# Patient Record
Sex: Female | Born: 1955 | Race: White | Hispanic: No | Marital: Married | Smoking: Former smoker
Health system: Southern US, Community
[De-identification: ages and names within clinical notes are randomized; demographics above are authoritative.]

## PROBLEM LIST (undated history)

## (undated) DIAGNOSIS — M1990 Unspecified osteoarthritis, unspecified site: Secondary | ICD-10-CM

## (undated) DIAGNOSIS — K219 Gastro-esophageal reflux disease without esophagitis: Secondary | ICD-10-CM

## (undated) DIAGNOSIS — F419 Anxiety disorder, unspecified: Secondary | ICD-10-CM

## (undated) DIAGNOSIS — E785 Hyperlipidemia, unspecified: Secondary | ICD-10-CM

## (undated) DIAGNOSIS — O24419 Gestational diabetes mellitus in pregnancy, unspecified control: Secondary | ICD-10-CM

## (undated) HISTORY — DX: Anxiety disorder, unspecified: F41.9

## (undated) HISTORY — DX: Gestational diabetes mellitus in pregnancy, unspecified control: O24.419

## (undated) HISTORY — PX: DILATION AND CURETTAGE OF UTERUS: SHX78

## (undated) HISTORY — DX: Hyperlipidemia, unspecified: E78.5

## (undated) HISTORY — DX: Unspecified osteoarthritis, unspecified site: M19.90

## (undated) HISTORY — PX: LAPAROSCOPIC HYSTERECTOMY: SHX1926

## (undated) HISTORY — DX: Gastro-esophageal reflux disease without esophagitis: K21.9

---

## 1990-06-30 HISTORY — PX: COLPOSCOPY: SHX161

## 1998-07-16 ENCOUNTER — Other Ambulatory Visit: Admission: RE | Admit: 1998-07-16 | Discharge: 1998-07-16 | Payer: Self-pay | Admitting: Gynecology

## 1999-07-25 ENCOUNTER — Other Ambulatory Visit: Admission: RE | Admit: 1999-07-25 | Discharge: 1999-07-25 | Payer: Self-pay | Admitting: Gynecology

## 2000-03-17 ENCOUNTER — Encounter: Payer: Self-pay | Admitting: Gynecology

## 2000-03-23 ENCOUNTER — Observation Stay (HOSPITAL_COMMUNITY): Admission: RE | Admit: 2000-03-23 | Discharge: 2000-03-24 | Payer: Self-pay | Admitting: Gynecology

## 2000-06-30 HISTORY — PX: LAPAROSCOPIC HYSTERECTOMY: SHX1926

## 2000-08-03 ENCOUNTER — Other Ambulatory Visit: Admission: RE | Admit: 2000-08-03 | Discharge: 2000-08-03 | Payer: Self-pay | Admitting: Gynecology

## 2001-07-08 ENCOUNTER — Ambulatory Visit (HOSPITAL_BASED_OUTPATIENT_CLINIC_OR_DEPARTMENT_OTHER): Admission: RE | Admit: 2001-07-08 | Discharge: 2001-07-08 | Payer: Self-pay | Admitting: Gynecology

## 2001-07-08 ENCOUNTER — Encounter (INDEPENDENT_AMBULATORY_CARE_PROVIDER_SITE_OTHER): Payer: Self-pay | Admitting: Specialist

## 2001-08-25 ENCOUNTER — Other Ambulatory Visit: Admission: RE | Admit: 2001-08-25 | Discharge: 2001-08-25 | Payer: Self-pay | Admitting: Gynecology

## 2002-09-19 ENCOUNTER — Other Ambulatory Visit: Admission: RE | Admit: 2002-09-19 | Discharge: 2002-09-19 | Payer: Self-pay | Admitting: Gynecology

## 2003-10-23 ENCOUNTER — Other Ambulatory Visit: Admission: RE | Admit: 2003-10-23 | Discharge: 2003-10-23 | Payer: Self-pay | Admitting: Gynecology

## 2004-04-29 ENCOUNTER — Other Ambulatory Visit: Admission: RE | Admit: 2004-04-29 | Discharge: 2004-04-29 | Payer: Self-pay | Admitting: Gynecology

## 2004-08-05 ENCOUNTER — Ambulatory Visit: Payer: Self-pay | Admitting: Family Medicine

## 2004-10-08 ENCOUNTER — Other Ambulatory Visit: Admission: RE | Admit: 2004-10-08 | Discharge: 2004-10-08 | Payer: Self-pay | Admitting: Gynecology

## 2005-07-23 ENCOUNTER — Ambulatory Visit: Payer: Self-pay | Admitting: Family Medicine

## 2005-11-10 ENCOUNTER — Other Ambulatory Visit: Admission: RE | Admit: 2005-11-10 | Discharge: 2005-11-10 | Payer: Self-pay | Admitting: Gynecology

## 2006-11-16 ENCOUNTER — Other Ambulatory Visit: Admission: RE | Admit: 2006-11-16 | Discharge: 2006-11-16 | Payer: Self-pay | Admitting: Gynecology

## 2007-11-18 ENCOUNTER — Encounter: Payer: Self-pay | Admitting: Family Medicine

## 2008-01-03 ENCOUNTER — Encounter: Admission: RE | Admit: 2008-01-03 | Discharge: 2008-01-03 | Payer: Self-pay | Admitting: Gynecology

## 2008-01-12 ENCOUNTER — Encounter: Admission: RE | Admit: 2008-01-12 | Discharge: 2008-01-12 | Payer: Self-pay | Admitting: Gynecology

## 2009-01-05 ENCOUNTER — Encounter: Admission: RE | Admit: 2009-01-05 | Discharge: 2009-01-05 | Payer: Self-pay | Admitting: Gynecology

## 2009-08-03 IMAGING — MG MM SCREEN MAMMOGRAM BILATERAL
3 series · 3 of 3 positions shown · non-contrast
Comparison: Prior studies from [HOSPITAL] dated 12/15/05.

DG SCREEN MAMMOGRAM BILATERAL
Bilateral CC and MLO view(s) were taken.

DIGITAL SCREENING MAMMOGRAM WITH CAD:

[R CC]
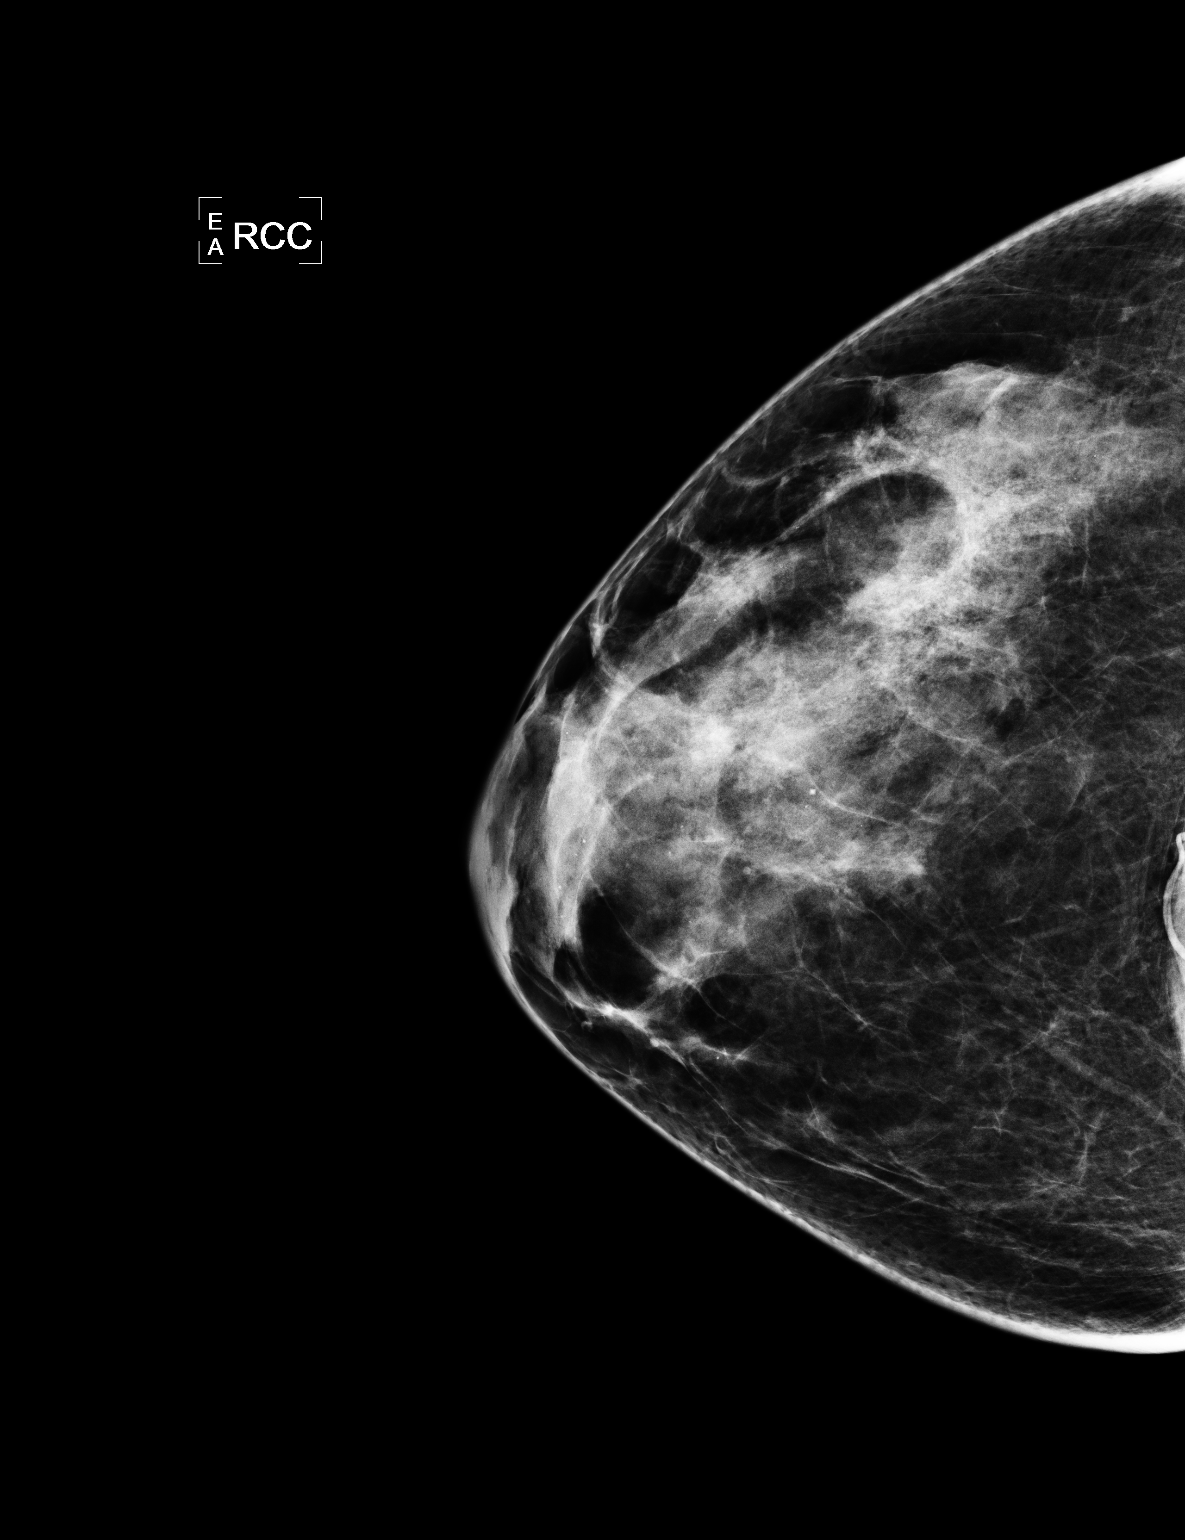

[L MLO]
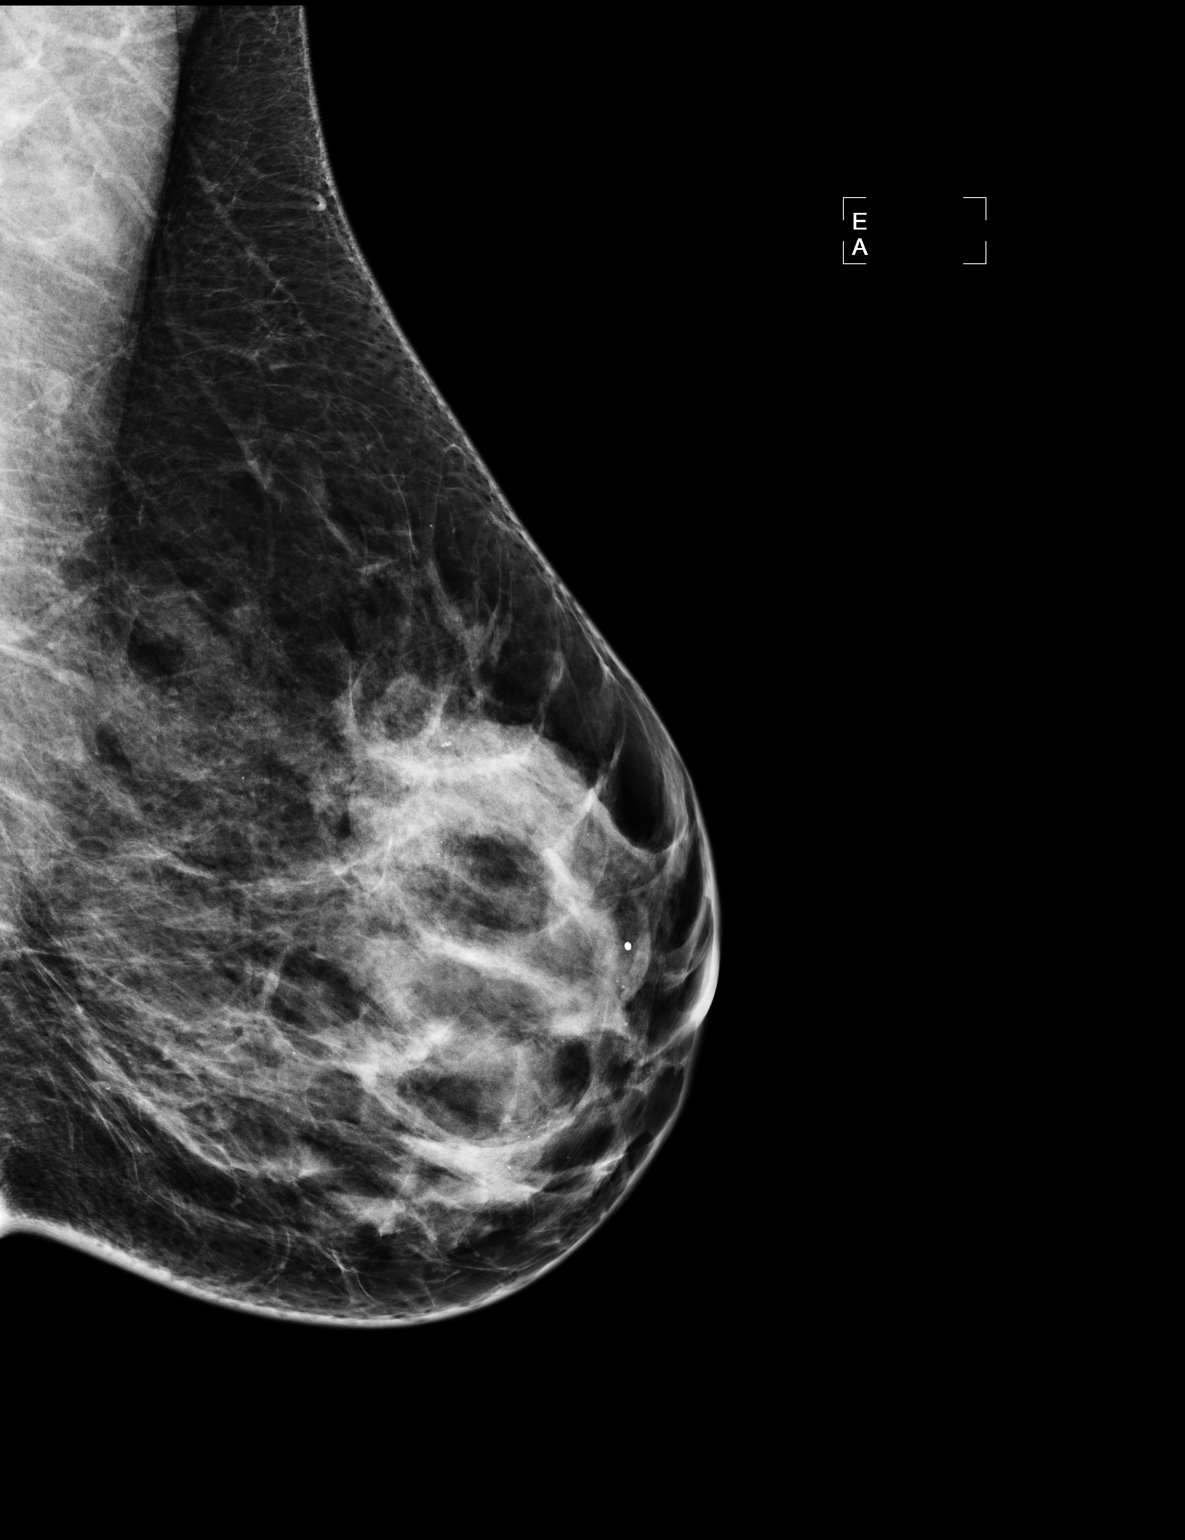

[R MLO]
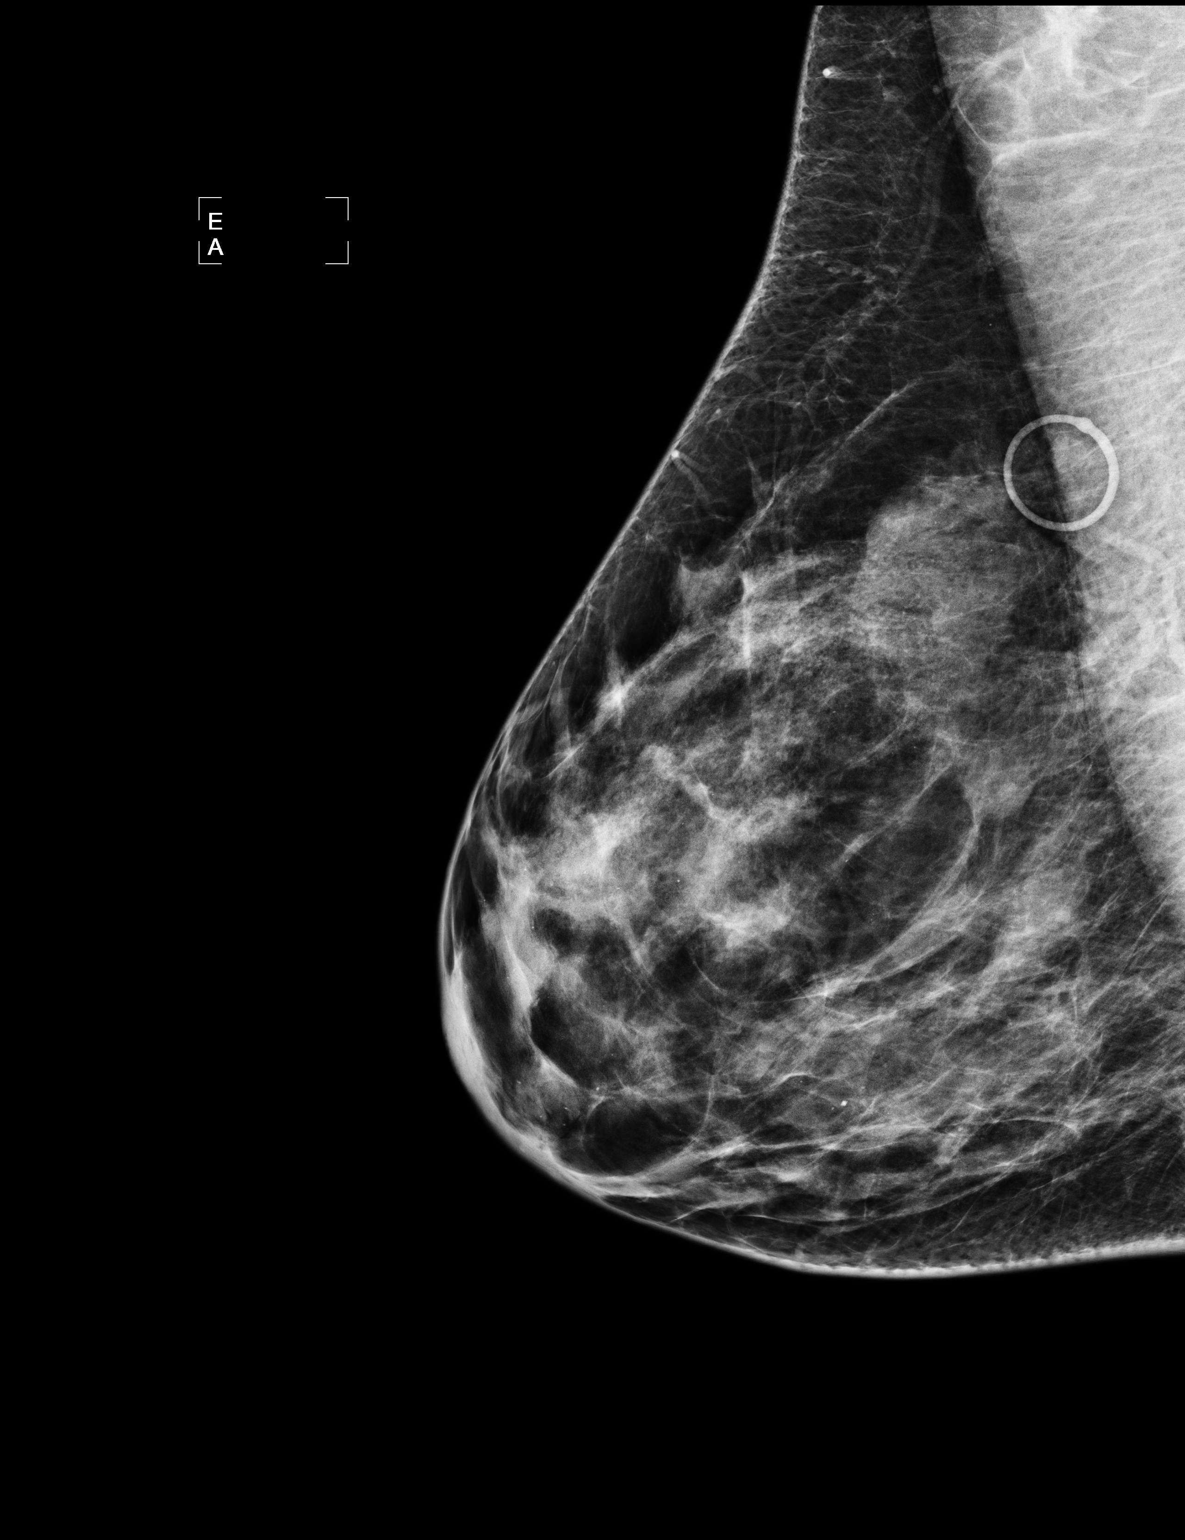

[3 of 3 positions shown; findings below may reference images not displayed]

The breast tissue is heterogeneously dense.  A possible mass is noted in the left breast.  Spot 
compression views and possibly sonography are recommended for further evaluation.  The right breast
is unremarkable.
IMPRESSION: Possible mass, left breast.  Additional evaluation is indicated.  The patient will be contacted for
additional studies and a supplemental report will follow.

ASSESSMENT: Need additional imaging evaluation and/or prior mammograms for comparison - BI-RADS 0 -
Left

Further imaging of the left breast.
ANALYZED BY COMPUTER AIDED DETECTION. , THIS PROCEDURE WAS A DIGITAL MAMMOGRAM.

## 2010-01-07 ENCOUNTER — Encounter: Admission: RE | Admit: 2010-01-07 | Discharge: 2010-01-07 | Payer: Self-pay | Admitting: Gynecology

## 2010-05-06 ENCOUNTER — Ambulatory Visit: Payer: Self-pay | Admitting: Cardiology

## 2010-11-15 NOTE — Op Note (Signed)
[facility]  Patient:    Cassie Vega, Cassie Vega [facility]                      MRN: [id number] Proc. Date: 03/23/00 Attending:  Gretta Cool, M.D. CC:         [facility]   Operative Report  PREOPERATIVE DIAGNOSES: 1. Leiomyomata with abnormal uterine bleeding and encroachment upon the    endometrial cavity. 2. History of abnormal genital cytology.  POSTOPERATIVE DIAGNOSES: 1. Leiomyomata with abnormal uterine bleeding and encroachment upon the    endometrial cavity. 2. History of abnormal genital cytology.  PROCEDURE:  Laparoscopically assisted vaginal hysterectomy.  SURGEON:  Dr. Nicholas Lose.  ASSISTANT:  Dr. Phyllis Ginger.  ANESTHESIA:  General.  DESCRIPTION OF PROCEDURE:  Under excellent general anesthesia endotracheal with the patient prepped and draped in Allen stirrups, the cervix was fitted with a Hulka tenaculum, bladder drained and a subumbilical incision made. After adequate pneumoperitoneum ______ the laparoscope trocar was introduced and pelvic organs visualized. The fibroid on the fundus of the uterus was confirmed. There was adequate anatomy so that it was felt that the procedure would not be difficult to accomplish though the adnexal structures were distorted by the fibroid. The adnexal pedicles were cauterized by Seitzinger tripolar forceps. The cautery was progressively accomplished until the uterine vessels were reached. At this point, attention was turned to the vaginal portion of the procedure. Note, inspection of all the pedicles above revealed no evidence of bleeding. The cervix was greatly deformed by a previous cold knife cone and by obstetric laceration. The cervix was relatively immobile. The mucosa was injected with xylocaine with epinephrine. The mucosa was then incised and the lower uterine segment mucosa advanced. The cul-de-sac was then entered and the uterosacral ligaments clamped, cut, sutured and tied with  #0 Vicryl. The cardinal ligaments were likewise clamped, cut, sutured and tied with #0 Vicryl. The bladder was then advanced off the lower uterine segment and the peritoneum opened beneath the bladder. A Deaver was placed for retraction. The uterine vessels were then clamped, cut, sutured and tied with #0 Vicryl. The intervening portion of broad ligament was then clamped with straight Masterson clamps and transected. Each pedicle was individually ligated by a free tie. The uterus was then delivered through the incision and the pelvis irrigated with lactated Ringers. There was no significant bleeding from the pedicles. The pelvic peritoneum was then closed by a pursestring suture from anterior to lateral pedicles posterior. It was then closed and the attention turned to suspension of the vaginal cuff. The cardinal and uterosacral ligament complex was then secured to the angle of the vagina with interrupted sutures of #0 Ethibond. The entire thickness of endopelvic fascia was then plicated with mattress sutures of #0 monocryl so as to provide a complete envelope of endopelvic fascia and prevent weakness or enterocele development. At this point, the procedure was terminated without complications. Reinspection of the abdominal pedicles revealed no evidence of bleeding. The pelvis is irrigated once again, gas allowed to escape and the abdominal incisions closed with interrupted sutures of 5-0 Vicryl and skin closure of Steri-Strips. At the end of the procedure, sponge and lap counts were correct with no complications. The patient returned to the recovery room in excellent condition. DD:  03/23/00 TD:  03/23/00 Job: [id number]

## 2010-11-15 NOTE — Op Note (Signed)
[facility]  Patient:    Cassie Vega, Cassie Vega [facility] Visit Number: [id number] MRN: [id number]          Service Type: NES Location: NESC Attending Physician:  Katrina Stack Dictated by:   Gretta Cool, M.D. Proc. Date: 07/08/01 Admit Date:  07/08/2001                             Operative Report  PREOPERATIVE DIAGNOSIS:  Vaginal foreign body (suture eroded through the apex of the cuff, permanent suture).  POSTOPERATIVE DIAGNOSIS:  Vaginal foreign body (suture eroded through the apex of the cuff, permanent suture).  PROCEDURES:  Excision of permanent suture from the apex of the cuff.  SURGEON:  Gretta Cool, M.D.  ANESTHESIA:  IV sedation and Marcaine 0.5% infiltration.  DESCRIPTION OF PROCEDURE:  Under excellent anesthesia as above with the patient prepped and draped in the lithotomy position, the suture was grasped and pulled down into view.  The granulation tissue about it was removed.  The suture was carefully elevated so as to be able to totally remove the suture. It was cut on one side and the remainder of the suture removed entirely.  The procedure was then terminated without complication.  The patient returned to the recovery room in excellent condition.  She was comfortable throughout. Dictated by:   Gretta Cool, M.D. Attending Physician:  Katrina Stack DD:  07/08/01 TD:  07/08/01 Job: [id number]

## 2010-11-15 NOTE — H&P (Signed)
[facility]  Patient:    Cassie Vega, Cassie Vega [facility]                      MRN: [id number] Adm. Date:  [id number] Attending:  Katrina Stack CC:         [facility]   History and Physical  CHIEF COMPLAINT:  Abnormal uterine bleeding.  HISTORY OF PRESENT ILLNESS:  [age]-year-old white married gravida 1, para 1, with increasingly severe abnormal uterine bleeding and well-documented uterine leiomyoma encroaching upon the cavity.  She has failed to respond to conservative therapy including oral contraceptives.  There is no other option of conservative therapy such as hysteroscopy and resection acceptable because of the location of the fibroid.  She is now admitted for definitive therapy by laparoscopically assisted hysterectomy versus total abdominal hysterectomy.  She has a history of abnormal genital cytology with cold-knife conization, 1992, for carcinoma in situ of the cervix.  She has no difficulty with incontinence or urine control or pelvic support problems.  PAST MEDICAL HISTORY:  Usual childhood diseases without sequelae. Medical illnesses:  None of consequence.  Accidents and injuries:  None.  ALLERGIES:  None known.  PREVIOUS SURGERY:  CKC, 1992.  FAMILY HISTORY:  Father died at 30 of heart disease.  Mother is diabetic, type 2, with arthritis and osteoporosis.  Maternal grandmother had breast cancer and colon cancer.  Moderate family history of hypercholesterolemia and cardiovascular disease.  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  Her husband is a self-employed Museum/gallery exhibitions officer.  REVIEW OF SYSTEMS:  HEENT:  Denies symptoms.  CARDIORESPIRATORY:  Denies asthma, cough, bronchitis, shortness of breath.  GI/GU:  Denies frequency, urgency, dysuria, change in bowel habits or food intolerance.  PHYSICAL EXAMINATION  GENERAL:  Well-developed, well-nourished white female.  HEENT:  PERLA.  Fundi  benign.  Oropharynx clear.  NECK:  Supple mass or thyroid enlargement.  CHEST:  Clear P-to-A.  HEART:  Regular rhythm, without murmur or cardiac enlargement.  BREASTS:  Soft, without mass, nodes or nipple discharge.  ABDOMEN:  Soft without mass or organomegaly.  PELVIC:  External genitalia normal female.  Vagina clear, rugose.  Cervix is deformed by previous CKC and descends very poorly.  Uterus is approximately 10-weeks-size with a fundal fibroid in the left fundal area.  By ultrasound, the fibroid encroaches upon the cavity and is thought to be responsible for abnormal bleeding.  She has no evidence of endometrial pathology. Rectovaginal confirms.  EXTREMITIES:  Negative.  NEUROLOGIC:  Physiologic.  IMPRESSIONS 1. Abnormal uterine bleeding, recurrent and unresponsive to conservative    therapy secondary to fundal uterine leiomyoma with encroachment on the    cavity. 2. Depression. 3. Abnormal genital cytology with history of cold-knife conization. 4. Strong family history of diabetes type 2. DD:  03/23/00 TD:  03/24/00 Job: [id number]

## 2010-12-17 ENCOUNTER — Other Ambulatory Visit: Payer: Self-pay | Admitting: Gynecology

## 2010-12-17 DIAGNOSIS — Z1231 Encounter for screening mammogram for malignant neoplasm of breast: Secondary | ICD-10-CM

## 2011-01-09 ENCOUNTER — Other Ambulatory Visit: Payer: Self-pay | Admitting: Gynecology

## 2011-01-09 ENCOUNTER — Ambulatory Visit
Admission: RE | Admit: 2011-01-09 | Discharge: 2011-01-09 | Disposition: A | Discharge status: Home / Self Care | Payer: BC Managed Care – PPO | Source: Ambulatory Visit | Attending: Gynecology | Admitting: Gynecology

## 2011-01-09 DIAGNOSIS — Z1231 Encounter for screening mammogram for malignant neoplasm of breast: Secondary | ICD-10-CM

## 2011-03-06 ENCOUNTER — Other Ambulatory Visit: Payer: Self-pay | Admitting: *Deleted

## 2011-03-06 DIAGNOSIS — Z8249 Family history of ischemic heart disease and other diseases of the circulatory system: Secondary | ICD-10-CM

## 2011-03-06 DIAGNOSIS — Z0000 Encounter for general adult medical examination without abnormal findings: Secondary | ICD-10-CM

## 2011-04-18 ENCOUNTER — Encounter: Payer: Self-pay | Admitting: Cardiovascular Disease

## 2011-04-25 ENCOUNTER — Ambulatory Visit (INDEPENDENT_AMBULATORY_CARE_PROVIDER_SITE_OTHER): Payer: BC Managed Care – PPO | Admitting: Cardiovascular Disease

## 2011-04-25 ENCOUNTER — Encounter: Payer: Self-pay | Admitting: Cardiovascular Disease

## 2011-04-25 ENCOUNTER — Other Ambulatory Visit (INDEPENDENT_AMBULATORY_CARE_PROVIDER_SITE_OTHER): Payer: BC Managed Care – PPO | Admitting: *Deleted

## 2011-04-25 VITALS — BP 100/68 | HR 66 | Ht 64.0 in | Wt 142.0 lb

## 2011-04-25 DIAGNOSIS — Z0000 Encounter for general adult medical examination without abnormal findings: Secondary | ICD-10-CM

## 2011-04-25 DIAGNOSIS — E785 Hyperlipidemia, unspecified: Secondary | ICD-10-CM | POA: Insufficient documentation

## 2011-04-25 DIAGNOSIS — Z8249 Family history of ischemic heart disease and other diseases of the circulatory system: Secondary | ICD-10-CM

## 2011-04-25 LAB — HEPATIC FUNCTION PANEL
ALT: 22 U/L (ref 0–35)
AST: 23 U/L (ref 0–37)
Bilirubin, Direct: 0.1 mg/dL (ref 0.0–0.3)
Total Protein: 6.5 g/dL (ref 6.0–8.3)

## 2011-04-25 LAB — LIPID PANEL
Cholesterol: 106 mg/dL (ref 0–200)
Total CHOL/HDL Ratio: 3
Triglycerides: 30 mg/dL (ref 0.0–149.0)

## 2011-04-25 LAB — BASIC METABOLIC PANEL
CO2: 31 mEq/L (ref 19–32)
Chloride: 107 mEq/L (ref 96–112)
Creatinine, Ser: 0.8 mg/dL (ref 0.4–1.2)
Potassium: 4.2 mEq/L (ref 3.5–5.1)

## 2011-04-25 NOTE — Assessment & Plan Note (Signed)
Her original HDL was 22. Her most recent HDL was 41. We'll continue with the Niaspan and the Crestor. We'll check her labs today. I'll see her again in one year for repeat office visit and lipid levels, HFP, BMP

## 2011-04-25 NOTE — Progress Notes (Signed)
Cassie Vega Date of Birth  [birth year] [facility] [address]    [address] [place], [place]  [address] [phone]  Fax  [phone]  History of Present Illness:  55 yo female - patient of Dr. Deborah Chalk.  We have followed her for dyslipidemia .  She has done well.  Her original HDL was 22.  Her last HDL was 41.  She is tolerating her medications very well. She's not had any episodes of chest pain or shortness breath. She exercises and does Yoga  on a regular basis.  Current Outpatient Prescriptions on File Prior to Visit  Medication Sig Dispense Refill  . aspirin 81 MG tablet Take 81 mg by mouth daily.        . cholecalciferol (VITAMIN D) 1000 UNITS tablet Take 1,000 Units by mouth daily.        Marland Kitchen estradiol (VIVELLE-DOT) 0.0375 MG/24HR Place 1 patch onto the skin 2 (two) times a week.        . fish oil-omega-3 fatty acids 1000 MG capsule Take 1,200 mg by mouth daily.        . niacin (NIASPAN) 1000 MG CR tablet Take 2,000 mg by mouth at bedtime.        . rosuvastatin (CRESTOR) 5 MG tablet Take 5 mg by mouth daily.          No Known Allergies  Past Medical History  Diagnosis Date  . Hyperlipidemia   . Anxiety   . Gestational diabetes     Past Surgical History  Procedure Date  . Laparoscopic hysterectomy   . Dilation and curettage of uterus     History  Smoking status  . Former Smoker  . Quit date: 06/30/1985  Smokeless tobacco  . Not on file    History  Alcohol Use No    Family History  Problem Relation Age of Onset  . Diabetes Mother   . Heart attack Mother   . Heart disease Father   . Hypertension Father   . Diabetes Father   . Hyperlipidemia Brother     Reviw of Systems:  Reviewed in the HPI.  All other systems are negative.  Physical Exam: BP 100/68  Pulse 66  Ht 5\' 4"  (1.626 m)  Wt 142 lb (64.411 kg)  BMI 24.37 kg/m2 The patient is alert and oriented x 3.  The mood and affect are normal.   Skin: warm and dry.  Color is normal.     HEENT:   normal carotids. She is no JVD.  Lungs: Clear to auscultation   Heart: Regular rate S1-S2. She has no murmurs.    Abdomen: Her belly is soft. His bowel sounds.  Extremities:  Shows no clubbing cyanosis or edema. There are no palpable cords.  Neuro:  Nonfocal. Her gait is normal.     Assessment / Plan:

## 2011-04-25 NOTE — Patient Instructions (Addendum)
Your physician recommends that you continue on your current medications as directed. Please refer to the Current Medication list given to you today.  Your physician wants you to follow-up in: 1 year, You will receive a reminder letter in the mail two months in advance. If you don't receive a letter, please call our office to schedule the follow-up appointment.  Your physician recommends that you have for a FASTING lipid profile: today and in one year

## 2011-07-05 ENCOUNTER — Other Ambulatory Visit: Payer: Self-pay | Admitting: Cardiology

## 2011-07-09 ENCOUNTER — Other Ambulatory Visit: Payer: Self-pay | Admitting: Cardiology

## 2011-07-13 ENCOUNTER — Other Ambulatory Visit: Payer: Self-pay | Admitting: Cardiology

## 2011-09-17 ENCOUNTER — Telehealth: Payer: Self-pay | Admitting: Cardiovascular Disease

## 2011-09-17 NOTE — Telephone Encounter (Signed)
New Msg: Pt calling wanting to speak with nurse/MD to find out what lab work MD would pt to have. Pt was told to make appt to get lab work drawn in April. Please return pt call to discuss further.

## 2011-09-17 NOTE — Telephone Encounter (Signed)
Reviewed chart/ labs not due for one year, pt agreed to plan.

## 2011-12-23 ENCOUNTER — Other Ambulatory Visit: Payer: Self-pay | Admitting: Gynecology

## 2011-12-23 DIAGNOSIS — Z1231 Encounter for screening mammogram for malignant neoplasm of breast: Secondary | ICD-10-CM

## 2012-01-12 ENCOUNTER — Ambulatory Visit
Admission: RE | Admit: 2012-01-12 | Discharge: 2012-01-12 | Disposition: A | Discharge status: Home / Self Care | Payer: BC Managed Care – PPO | Source: Ambulatory Visit | Attending: Gynecology | Admitting: Gynecology

## 2012-01-12 DIAGNOSIS — Z1231 Encounter for screening mammogram for malignant neoplasm of breast: Secondary | ICD-10-CM

## 2012-01-15 ENCOUNTER — Other Ambulatory Visit: Payer: Self-pay | Admitting: Gynecology

## 2012-01-15 ENCOUNTER — Encounter: Payer: Self-pay | Admitting: Cardiovascular Disease

## 2012-01-26 ENCOUNTER — Telehealth: Payer: Self-pay | Admitting: Cardiovascular Disease

## 2012-01-26 NOTE — Telephone Encounter (Signed)
New Problem:    Called to say that NIASPAN 500 MG CR tablet was no longer available and would like to know how to proceed.  Please call back.

## 2012-01-26 NOTE — Telephone Encounter (Signed)
Spoke with pharmacist- niaspan on back order, pt to rely on diet/ exercise and wt loss to manage till niaspan available per Dr Elease Hashimoto

## 2012-06-30 HISTORY — PX: BUNIONECTOMY: SHX129

## 2012-12-08 ENCOUNTER — Other Ambulatory Visit: Payer: Self-pay

## 2012-12-08 DIAGNOSIS — Z1231 Encounter for screening mammogram for malignant neoplasm of breast: Secondary | ICD-10-CM

## 2013-01-12 ENCOUNTER — Ambulatory Visit: Payer: BC Managed Care – PPO

## 2013-01-20 ENCOUNTER — Ambulatory Visit
Admission: RE | Admit: 2013-01-20 | Discharge: 2013-01-20 | Disposition: A | Discharge status: Home / Self Care | Payer: BC Managed Care – PPO | Source: Ambulatory Visit

## 2013-01-20 DIAGNOSIS — Z1231 Encounter for screening mammogram for malignant neoplasm of breast: Secondary | ICD-10-CM

## 2013-08-12 IMAGING — MG MM SCREEN MAMMOGRAM BILATERAL
4 series · 4 of 4 positions shown · non-contrast
Comparison: Previous exams

CLINICAL DATA: Screening.

DIGITAL SCREENING MAMMOGRAM WITH CAD

[R CC]
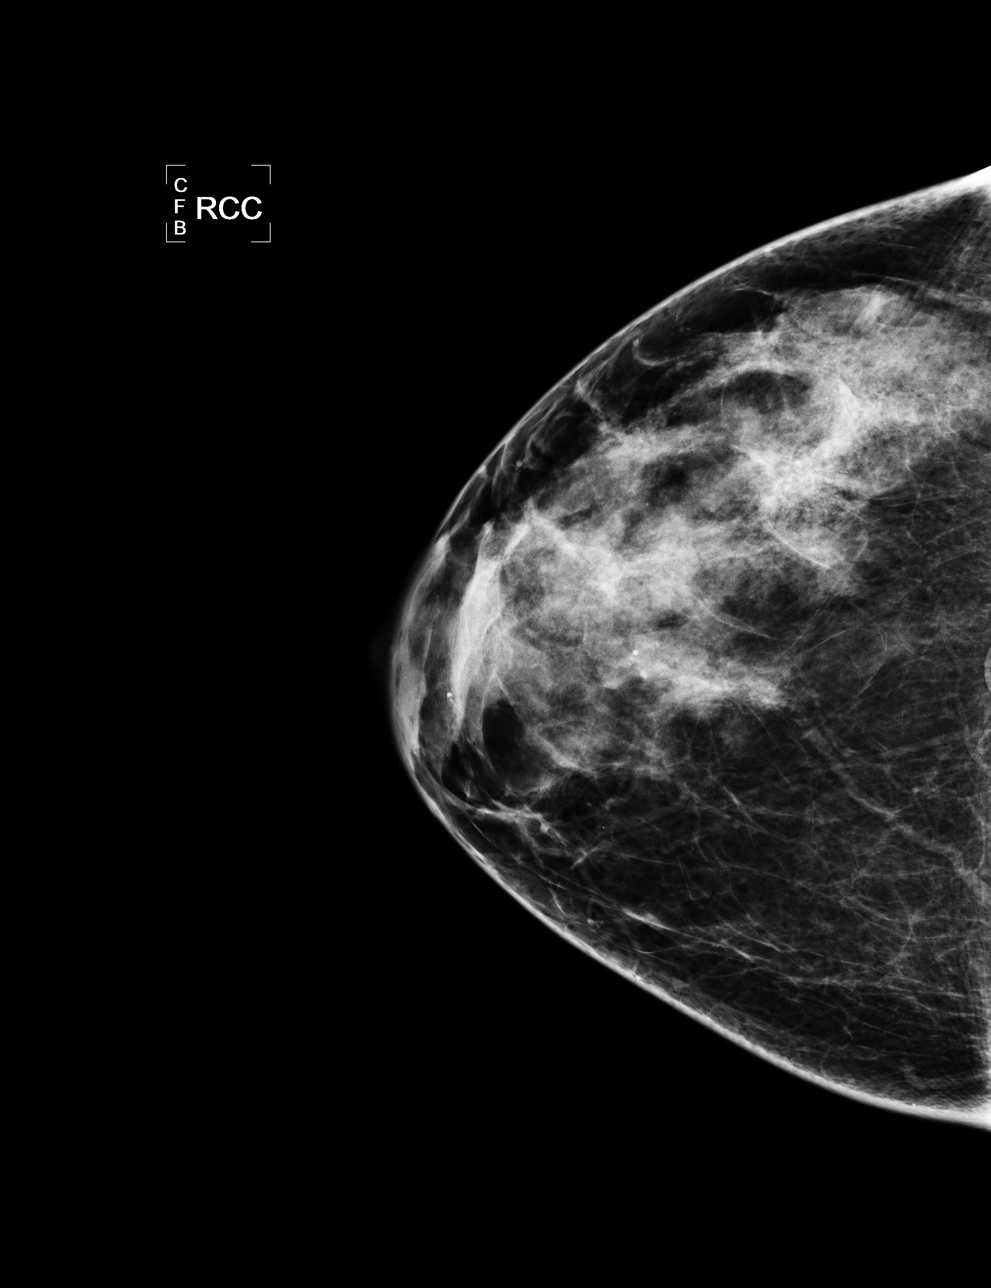

[L CC]
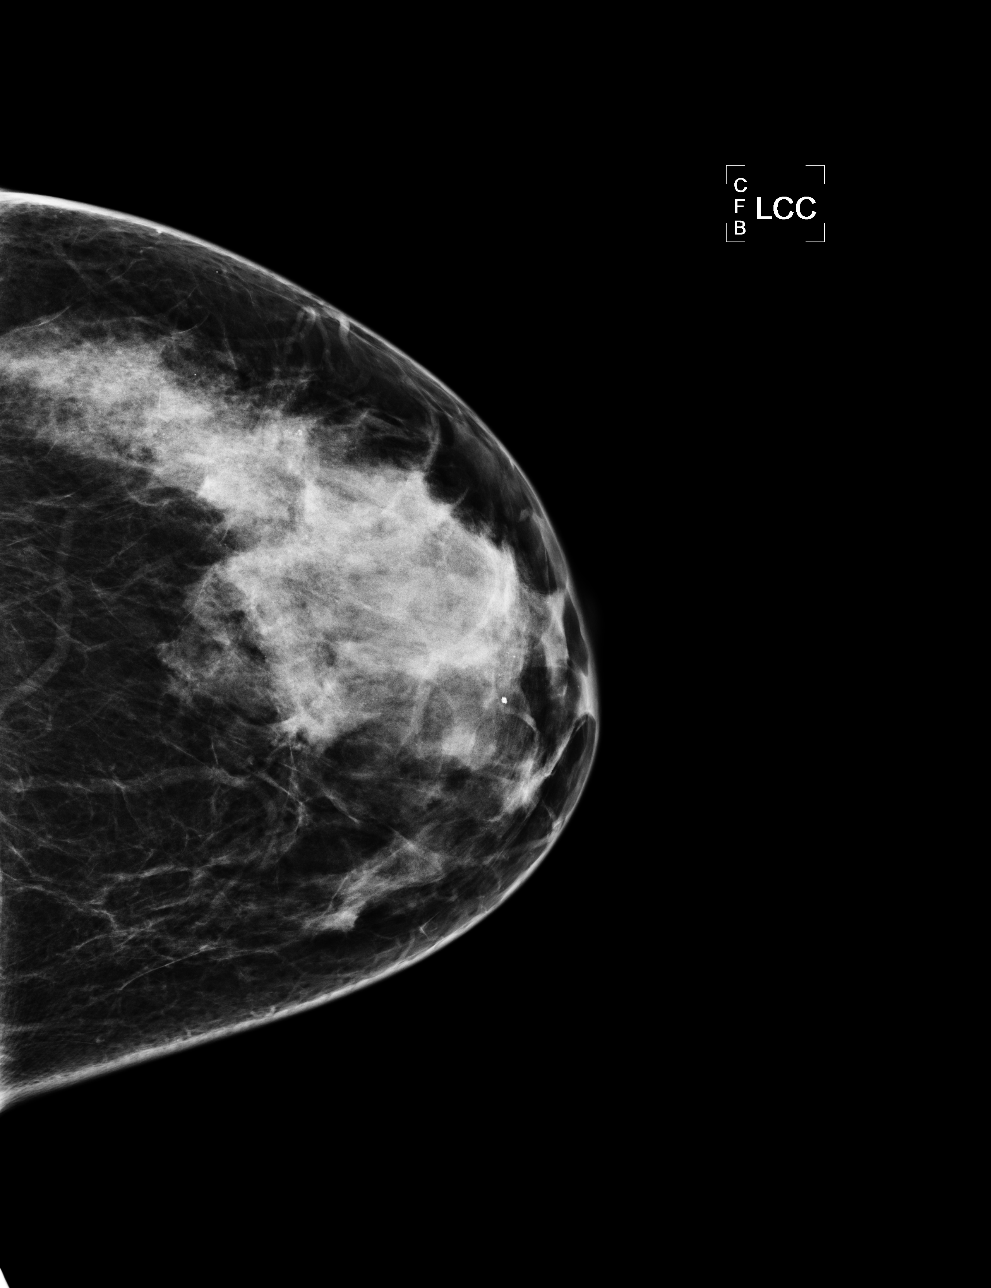

[L MLO]
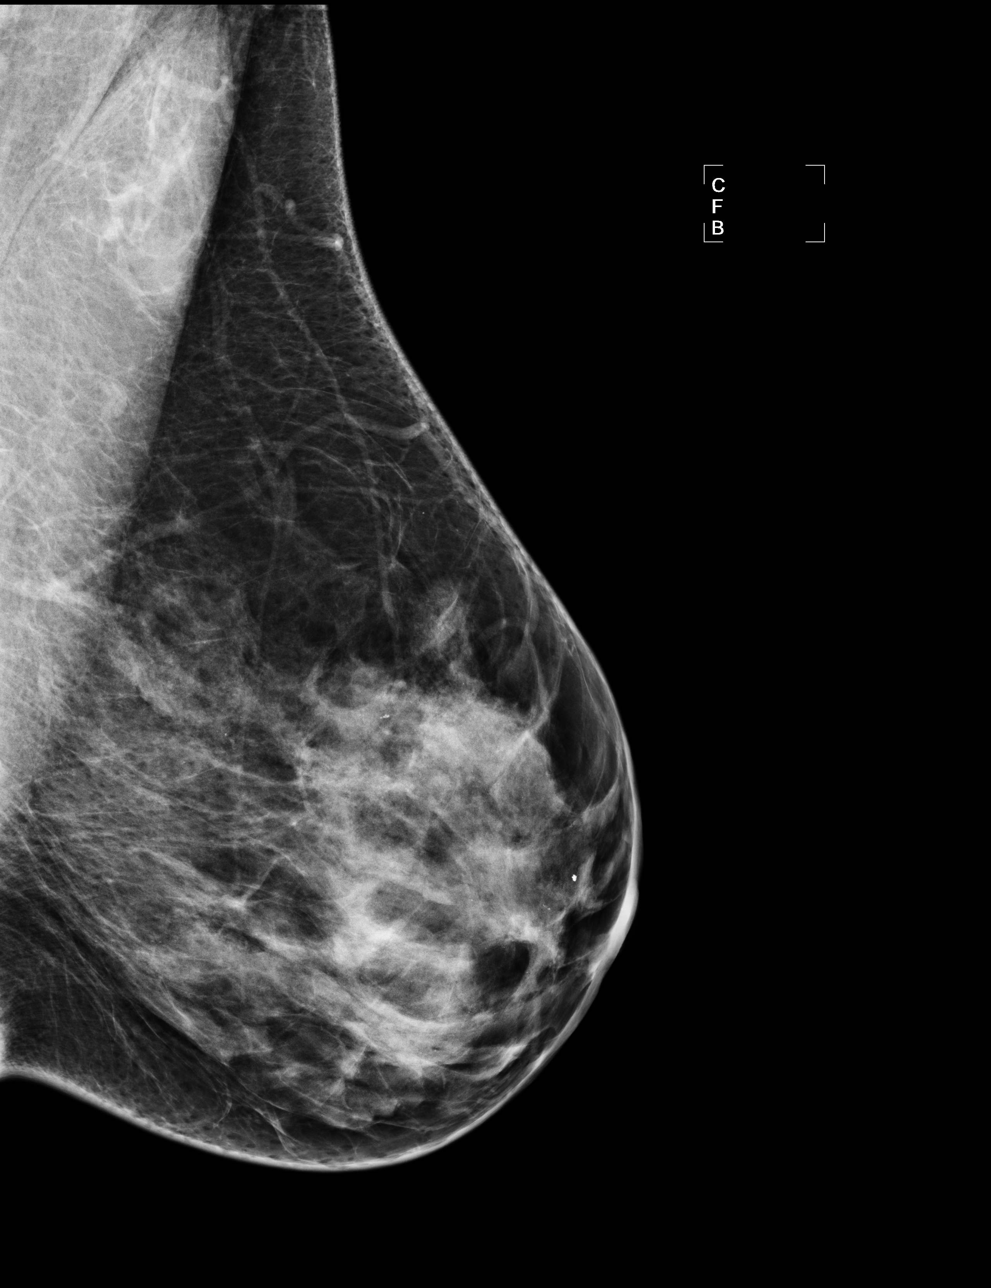

[R MLO]
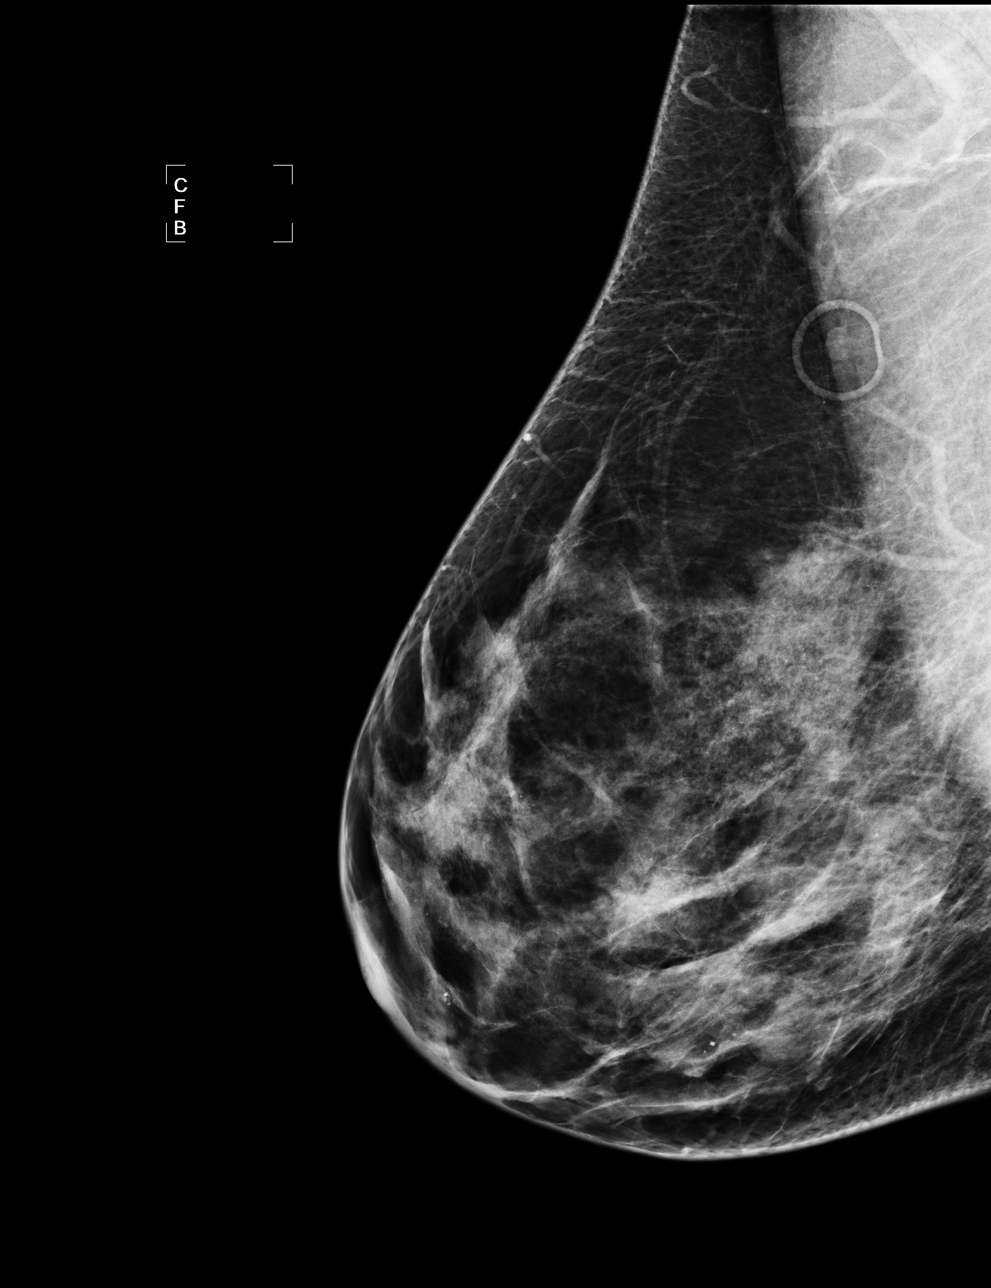

[4 of 4 positions shown; findings below may reference images not displayed]

FINDINGS: The breast tissue is extremely dense. No suspicious
masses, architectural distortion, or calcifications are present.

Images were processed with CAD.
IMPRESSION: No specific mammographic evidence of malignancy.

A result letter of this screening mammogram will be mailed directly
to the patient.

RECOMMENDATION:
Screening mammogram in one year. (Code:UI-R-P19)

BI-RADS CATEGORY 2:  Benign finding(s).

## 2015-04-03 DIAGNOSIS — N952 Postmenopausal atrophic vaginitis: Secondary | ICD-10-CM | POA: Insufficient documentation

## 2018-01-07 ENCOUNTER — Ambulatory Visit: Payer: Managed Care, Other (non HMO) | Admitting: Podiatry

## 2018-01-07 ENCOUNTER — Encounter: Payer: Self-pay | Admitting: Podiatry

## 2018-01-07 ENCOUNTER — Other Ambulatory Visit: Payer: Self-pay | Admitting: Podiatry

## 2018-01-07 ENCOUNTER — Ambulatory Visit (INDEPENDENT_AMBULATORY_CARE_PROVIDER_SITE_OTHER): Payer: Managed Care, Other (non HMO)

## 2018-01-07 VITALS — BP 104/57 | HR 59 | Temp 98.6°F | Resp 16 | Ht 64.0 in | Wt 120.0 lb

## 2018-01-07 DIAGNOSIS — M79672 Pain in left foot: Secondary | ICD-10-CM | POA: Diagnosis not present

## 2018-01-07 DIAGNOSIS — Z472 Encounter for removal of internal fixation device: Secondary | ICD-10-CM

## 2018-01-07 NOTE — Progress Notes (Signed)
   Subjective:    Patient ID: Cassie MattesLeslie Ann Vega, female    DOB: [birth year], 62 y.o.   MRN: [id number]  HPI    Review of Systems  All other systems reviewed and are negative.      Objective:   Physical Exam        Assessment & Plan:

## 2018-01-07 NOTE — Progress Notes (Signed)
Subjective:   Patient ID: Cassie Vega, female   DOB: 62 y.o.   MRN: [id number]   HPI Patient presents with pain on top of the left foot and does not remember specific injury and is been going on for several months.  Patient's tried wider shoes and other modalities without relief of symptoms   Review of Systems  All other systems reviewed and are negative.       Objective:  Physical Exam  Constitutional: She appears well-developed and well-nourished.  Cardiovascular: Intact distal pulses.  Pulmonary/Chest: Effort normal.  Musculoskeletal: Normal range of motion.  Neurological: She is alert.  Skin: Skin is warm.  Nursing note and vitals reviewed.   Neurovascular status found to be intact with muscle strength adequate range of motion within normal limits with patient noted to have a prominent area on the first metatarsal shaft left with previous pin was put in most likely and also is noted to have mild varus component of the left first metatarsal but it functions well with no restriction or crepitus of the joint with good range of motion.  Patient is found to have good digital perfusion well oriented x3     Assessment:  Abnormal pin position left first metatarsal painful with probable cyst formation around     Plan:  H&P condition reviewed and at this point due to the discomfort and the prominence of the pad I recommended pin removal.  I allowed her to go over consent form reviewing pin removal and she wants this done signed consent form after review and is scheduled for office surgery.  Other than that everything seems to be doing very well  X-ray indicates that the area is right on the pin of the first metatarsal proximal proximal pin and that there also is a mild varus deformity that is not gotten worse over the years and functions very well clinically

## 2018-01-07 NOTE — Patient Instructions (Signed)
Pre-Operative Instructions  Congratulations, you have decided to take an important step towards improving your quality of life.  You can be assured that the doctors and staff at [facility] will be with you every step of the way.  Here are some important things you should know:  1. Plan to be at the surgery center/hospital at least 1 (one) hour prior to your scheduled time, unless otherwise directed by the surgical center/hospital staff.  You must have a responsible adult accompany you, remain during the surgery and drive you home.  Make sure you have directions to the surgical center/hospital to ensure you arrive on time. 2. If you are having surgery at [facility] or [facility], you will need a copy of your medical history and physical form from your family physician within one month prior to the date of surgery. We will give you a form for your primary physician to complete.  3. We make every effort to accommodate the date you request for surgery.  However, there are times where surgery dates or times have to be moved.  We will contact you as soon as possible if a change in schedule is required.   4. No aspirin/ibuprofen for one week before surgery.  If you are on aspirin, any non-steroidal anti-inflammatory medications (Mobic, Aleve, Ibuprofen) should not be taken seven (7) days prior to your surgery.  You make take Tylenol for pain prior to surgery.  5. Medications - If you are taking daily heart and blood pressure medications, seizure, reflux, allergy, asthma, anxiety, pain or diabetes medications, make sure you notify the surgery center/hospital before the day of surgery so they can tell you which medications you should take or avoid the day of surgery. 6. No food or drink after midnight the night before surgery unless directed otherwise by surgical center/hospital staff. 7. No alcoholic beverages 24-hours prior to surgery.  No smoking 24-hours prior or 24-hours after  surgery. 8. Wear loose pants or shorts. They should be loose enough to fit over bandages, boots, and casts. 9. Don't wear slip-on shoes. Sneakers are preferred. 10. Bring your boot with you to the surgery center/hospital.  Also bring crutches or a walker if your physician has prescribed it for you.  If you do not have this equipment, it will be provided for you after surgery. 11. If you have not been contacted by the surgery center/hospital by the day before your surgery, call to confirm the date and time of your surgery. 12. Leave-time from work may vary depending on the type of surgery you have.  Appropriate arrangements should be made prior to surgery with your employer. 13. Prescriptions will be provided immediately following surgery by your doctor.  Fill these as soon as possible after surgery and take the medication as directed. Pain medications will not be refilled on weekends and must be approved by the doctor. 14. Remove nail polish on the operative foot and avoid getting pedicures prior to surgery. 15. Wash the night before surgery.  The night before surgery wash the foot and leg well with water and the antibacterial soap provided. Be sure to pay special attention to beneath the toenails and in between the toes.  Wash for at least three (3) minutes. Rinse thoroughly with water and dry well with a towel.  Perform this wash unless told not to do so by your physician.  Enclosed: 1 Ice pack (please put in freezer the night before surgery)   1 Hibiclens skin cleaner     Pre-op instructions  If you have any questions regarding the instructions, please do not hesitate to call our office.  [place]: [address], [place], [place] [address] -- [phone]  [place]: [address] [place], [place] [address] -- [phone]  [place]: [address]  [place], [place] [address] -- [phone]  [place]: [address], [address], [place], [place] [address] -- [phone]  Website:  [email or web address] 

## 2018-01-08 ENCOUNTER — Telehealth: Payer: Self-pay | Admitting: *Deleted

## 2018-01-08 NOTE — Telephone Encounter (Signed)
"  I was in yesterday and saw Dr. Charlsie Merlesegal and scheduled a procedure on July 24.  The printout about the instructions does not tell me what time to be there.  I think the nurse said 7:00.  So if you would, call me back."

## 2018-01-15 NOTE — Telephone Encounter (Signed)
I was in to see Dr. Charlsie Merlesegal a week ago today.  I have a procedure scheduled with him next Wednesday, the 24th.  I just have a few questions.  I think the nurse said to be there at 7 or 7:15, I'm not really sure.  She didn't mention how I'm supposed to dress or some of the small details.  If you would give me a call back on my cell number."  I am returning your call.  You will need to be here at 7:45 am.  "Is there anything else I need to do other than clean my foot with the brush?"  If you still have a surgical shoe, bring it with you.  "I think I still have it, it's been years ago since I had to wear it."  Bring it with you if you can find it.  If not, we will give you another one.  "So there's nothing else I need to do?  This is pretty simple."

## 2018-01-20 ENCOUNTER — Encounter: Payer: Self-pay | Admitting: Podiatry

## 2018-01-20 ENCOUNTER — Ambulatory Visit (INDEPENDENT_AMBULATORY_CARE_PROVIDER_SITE_OTHER): Payer: Managed Care, Other (non HMO) | Admitting: Podiatry

## 2018-01-20 VITALS — BP 128/71 | HR 73 | Temp 98.5°F | Ht 64.0 in | Wt 128.0 lb

## 2018-01-20 DIAGNOSIS — Z472 Encounter for removal of internal fixation device: Secondary | ICD-10-CM | POA: Diagnosis not present

## 2018-01-20 DIAGNOSIS — N951 Menopausal and female climacteric states: Secondary | ICD-10-CM | POA: Insufficient documentation

## 2018-01-20 NOTE — Progress Notes (Signed)
Subjective:   Patient ID: Meribeth MattesLeslie Ann Roedl, female   DOB: 62 y.o.   MRN: [id number]   HPI Patient presents stating she is developed a large knot on top of her left foot and is increasingly painful   ROS      Objective:  Physical Exam  Neurovascular status intact with nodule in the left first metatarsal shaft that is in proximity to a previous pin that had been inserted and has done well for a long period     Assessment:  Probability for a reaction to pin that occurred long after initial surgery     Plan:  Patient was injected with 60 mg like Marcaine mixture and taken to the OR where sterile prep was applied to the left foot.  The patient was prepped and draped utilizing standard aseptic technique and the left foot was exsanguinated with Ace wrap and the tourniquet inflated 210 mmHg.  Following procedure was performed attention was directed to the dorsal aspect left first metatarsal where an approximate 1 cm incision was made centered over the offending mass.  The incision was deepened and it was found to be a fluid that was removed in this area that was thick and consistent with ganglionic.  This was excised entirely the wound was flushed and I noted the pin to be a direct contact to this area and utilizing sharp sterile instrumentation the pin was removed in toto the wound was flushed with copious nonsterile Garamycin solution and sutured utilizing 5-0 nylon.  Sterile dressing applied to left foot and left ankle tourniquet was deflated and capillary fill noted to be immediate and patient will return in 2 weeks or earlier if needed

## 2018-02-03 ENCOUNTER — Ambulatory Visit (INDEPENDENT_AMBULATORY_CARE_PROVIDER_SITE_OTHER): Payer: Self-pay | Admitting: Podiatry

## 2018-02-03 ENCOUNTER — Ambulatory Visit (INDEPENDENT_AMBULATORY_CARE_PROVIDER_SITE_OTHER): Payer: Managed Care, Other (non HMO)

## 2018-02-03 DIAGNOSIS — Z472 Encounter for removal of internal fixation device: Secondary | ICD-10-CM

## 2018-02-04 NOTE — Progress Notes (Signed)
Subjective:   Patient ID: Cassie Vega, female   DOB: 62 y.o.   MRN: [id number]   HPI Patient states her foot is feeling much better and she is very happy with getting the pin removed   ROS      Objective:  Physical Exam  Neurovascular status unchanged with wound edges well coapted left forefoot from previous pin removal     Assessment:  Doing well post pin removal left first metatarsal     Plan:  Stitches removed sterile dressing applied instructed on gradual increase in activity will be seen back as needed  X-ray indicates satisfactory removal of fixation device

## 2018-06-04 ENCOUNTER — Other Ambulatory Visit: Payer: Self-pay | Admitting: Obstetrics and Gynecology

## 2018-06-04 DIAGNOSIS — R928 Other abnormal and inconclusive findings on diagnostic imaging of breast: Secondary | ICD-10-CM

## 2018-06-11 ENCOUNTER — Ambulatory Visit
Admission: RE | Admit: 2018-06-11 | Discharge: 2018-06-11 | Disposition: A | Discharge status: Home / Self Care | Payer: Managed Care, Other (non HMO) | Source: Ambulatory Visit | Attending: Obstetrics and Gynecology | Admitting: Obstetrics and Gynecology

## 2018-06-11 DIAGNOSIS — R928 Other abnormal and inconclusive findings on diagnostic imaging of breast: Secondary | ICD-10-CM

## 2020-02-14 ENCOUNTER — Encounter: Payer: Self-pay | Admitting: Gastroenterology

## 2020-03-26 ENCOUNTER — Ambulatory Visit (AMBULATORY_SURGERY_CENTER): Payer: Managed Care, Other (non HMO) | Admitting: *Deleted

## 2020-03-26 ENCOUNTER — Encounter: Payer: Self-pay | Admitting: Gastroenterology

## 2020-03-26 ENCOUNTER — Other Ambulatory Visit: Payer: Self-pay

## 2020-03-26 VITALS — Ht 64.0 in | Wt 129.0 lb

## 2020-03-26 DIAGNOSIS — Z1211 Encounter for screening for malignant neoplasm of colon: Secondary | ICD-10-CM

## 2020-03-26 MED ORDER — PLENVU 140 G PO SOLR: 1.0000 | Freq: Once | ORAL | 0 refills | Status: AC

## 2020-03-26 NOTE — Progress Notes (Signed)

## 2020-04-10 ENCOUNTER — Encounter: Payer: Managed Care, Other (non HMO) | Admitting: Gastroenterology

## 2020-08-15 ENCOUNTER — Encounter: Payer: Self-pay | Admitting: Gastroenterology

## 2020-08-28 ENCOUNTER — Ambulatory Visit (AMBULATORY_SURGERY_CENTER): Payer: Managed Care, Other (non HMO)

## 2020-08-28 ENCOUNTER — Other Ambulatory Visit: Payer: Self-pay

## 2020-08-28 ENCOUNTER — Encounter: Payer: Self-pay | Admitting: Gastroenterology

## 2020-08-28 VITALS — Ht 64.0 in | Wt 129.0 lb

## 2020-08-28 DIAGNOSIS — Z1211 Encounter for screening for malignant neoplasm of colon: Secondary | ICD-10-CM

## 2020-08-28 MED ORDER — NA SULFATE-K SULFATE-MG SULF 17.5-3.13-1.6 GM/177ML PO SOLN: 1.0000 | Freq: Once | ORAL | 0 refills | Status: DC

## 2020-08-28 NOTE — Progress Notes (Signed)
Pre visit completed via phone call; Patient verified name, DOB, and address;  No egg or soy allergy known to patient  No issues with past sedation with any surgeries or procedures No intubation problems in the past  No FH of Malignant Hyperthermia No diet pills per patient No home 02 use per patient  No blood thinners per patient  Pt denies issues with constipation  No A fib or A flutter  COVID 19 guidelines implemented in PV today with Pt and RN  Pt denies loose or missing teeth, dentures, partials, dental implants, bonded teeth; patient reports crowns; Coupon given to pt in PV today, Code to Pharmacy and  NO PA's for preps discussed with pt in PV today  Discussed with pt there will be an out-of-pocket cost for prep and that varies from $0 to 70 dollars  Due to the COVID-19 pandemic we are asking patients to follow certain guidelines. Pt aware of COVID protocols and [facility] guidelines  Has plenvu prep at home-per pt

## 2020-09-11 ENCOUNTER — Ambulatory Visit (AMBULATORY_SURGERY_CENTER): Payer: Managed Care, Other (non HMO) | Admitting: Gastroenterology

## 2020-09-11 ENCOUNTER — Encounter: Payer: Self-pay | Admitting: Gastroenterology

## 2020-09-11 ENCOUNTER — Other Ambulatory Visit: Payer: Self-pay

## 2020-09-11 VITALS — BP 101/46 | HR 50 | Temp 98.4°F | Resp 17 | Ht 64.0 in | Wt 129.0 lb

## 2020-09-11 DIAGNOSIS — Z1211 Encounter for screening for malignant neoplasm of colon: Secondary | ICD-10-CM

## 2020-09-11 MED ORDER — SODIUM CHLORIDE 0.9 % IV SOLN: 500.0000 mL | Freq: Once | INTRAVENOUS | Status: DC

## 2020-09-11 NOTE — Patient Instructions (Signed)
Handout provided on diverticulosis.  ° °Repeat colonoscopy in 10 years for screening purposes.  ° °YOU HAD AN ENDOSCOPIC PROCEDURE TODAY AT THE [facility]:   Refer to the procedure report that was given to you for any specific questions about what was found during the examination.  If the procedure report does not answer your questions, please call your gastroenterologist to clarify.  If you requested that your care partner not be given the details of your procedure findings, then the procedure report has been included in a sealed envelope for you to review at your convenience later. ° °YOU SHOULD EXPECT: Some feelings of bloating in the abdomen. Passage of more gas than usual.  Walking can help get rid of the air that was put into your GI tract during the procedure and reduce the bloating. If you had a lower endoscopy (such as a colonoscopy or flexible sigmoidoscopy) you may notice spotting of blood in your stool or on the toilet paper. If you underwent a bowel prep for your procedure, you may not have a normal bowel movement for a few days. ° °Please Note:  You might notice some irritation and congestion in your nose or some drainage.  This is from the oxygen used during your procedure.  There is no need for concern and it should clear up in a day or so. ° °SYMPTOMS TO REPORT IMMEDIATELY: ° °Following lower endoscopy (colonoscopy or flexible sigmoidoscopy): ° Excessive amounts of blood in the stool ° Significant tenderness or worsening of abdominal pains ° Swelling of the abdomen that is new, acute ° Fever of 100°F or higher ° °For urgent or emergent issues, a gastroenterologist can be reached at any hour by calling [phone]. °Do not use MyChart messaging for urgent concerns.  ° ° °DIET:  We do recommend a small meal at first, but then you may proceed to your regular diet.  Drink plenty of fluids but you should avoid alcoholic beverages for 24 hours. ° °ACTIVITY:  You should plan to take it  easy for the rest of today and you should NOT DRIVE or use heavy machinery until tomorrow (because of the sedation medicines used during the test).   ° °FOLLOW UP: °Our staff will call the number listed on your records 48-72 hours following your procedure to check on you and address any questions or concerns that you may have regarding the information given to you following your procedure. If we do not reach you, we will leave a message.  We will attempt to reach you two times.  During this call, we will ask if you have developed any symptoms of COVID 19. If you develop any symptoms (ie: fever, flu-like symptoms, shortness of breath, cough etc.) before then, please call [phone].  If you test positive for Covid 19 in the 2 weeks post procedure, please call and report this information to us.   ° °If any biopsies were taken you will be contacted by phone or by letter within the next 1-3 weeks.  Please call us at [phone] if you have not heard about the biopsies in 3 weeks.  ° ° °SIGNATURES/CONFIDENTIALITY: °You and/or your care partner have signed paperwork which will be entered into your electronic medical record.  These signatures attest to the fact that that the information above on your After Visit Summary has been reviewed and is understood.  Full responsibility of the confidentiality of this discharge information lies with you and/or your care-partner. ° °

## 2020-09-11 NOTE — Op Note (Signed)
[facility] Patient Name: Cassie Vega Procedure Date: 09/11/2020 9:04 AM MRN: [id number] Endoscopist: Sherilyn Cooter L. Myrtie Neither , MD Age: 65 Referring MD:  Date of Birth: 10/11/1955 Gender: Female Account #: [id number] Procedure:                Colonoscopy Indications:              Screening for colorectal malignant neoplasm                            (patient reported no polyps on last colonoscopy >                            10 years prior) Medicines:                Monitored Anesthesia Care Procedure:                Pre-Anesthesia Assessment:                           - Prior to the procedure, a History and Physical                            was performed, and patient medications and                            allergies were reviewed. The patient's tolerance of                            previous anesthesia was also reviewed. The risks                            and benefits of the procedure and the sedation                            options and risks were discussed with the patient.                            All questions were answered, and informed consent                            was obtained. Prior Anticoagulants: The patient has                            taken no previous anticoagulant or antiplatelet                            agents. ASA Grade Assessment: II - A patient with                            mild systemic disease. After reviewing the risks                            and benefits, the patient was deemed in  satisfactory condition to undergo the procedure.                           After obtaining informed consent, the colonoscope                            was passed under direct vision. Throughout the                            procedure, the patient's blood pressure, pulse, and                            oxygen saturations were monitored continuously. The                            Olympus CF-HQ190L ([id number]) Colonoscope was                             introduced through the anus and advanced to the the                            cecum, identified by appendiceal orifice and                            ileocecal valve. The colonoscopy was performed with                            difficulty due to a redundant colon. The patient                            tolerated the procedure well. The quality of the                            bowel preparation was good. The ileocecal valve,                            appendiceal orifice, and rectum were photographed.                            The bowel preparation used was Plenvu. Scope In: 9:12:05 AM Scope Out: 9:32:43 AM Scope Withdrawal Time: 0 hours 11 minutes 15 seconds  Total Procedure Duration: 0 hours 20 minutes 38 seconds  Findings:                 The perianal and digital rectal examinations were                            normal.                           Many diverticula were found in the left colon.                            There was associated tortuosity and redundancy.  There is no endoscopic evidence of polyps in the                            entire colon.                           The exam was otherwise without abnormality on                            direct and retroflexion views. Complications:            No immediate complications. Estimated Blood Loss:     Estimated blood loss: none. Impression:               - Diverticulosis in the left colon.                           - The examination was otherwise normal on direct                            and retroflexion views.                           - No specimens collected. Recommendation:           - Patient has a contact number available for                            emergencies. The signs and symptoms of potential                            delayed complications were discussed with the                            patient. Return to normal activities tomorrow.                             Written discharge instructions were provided to the                            patient.                           - Resume previous diet.                           - Continue present medications.                           - Repeat colonoscopy in 10 years for screening                            purposes. Janaa Acero L. Myrtie Neither, MD 09/11/2020 9:37:43 AM This report has been signed electronically.

## 2020-09-11 NOTE — Progress Notes (Signed)
Pt's states no medical or surgical changes since previsit or office visit.  Check-in-cw 

## 2020-09-11 NOTE — Progress Notes (Signed)
pt tolerated well. VSS. awake and to recovery. Report given to RN.  

## 2020-09-13 ENCOUNTER — Telehealth: Payer: Self-pay | Admitting: *Deleted

## 2020-09-13 NOTE — Telephone Encounter (Signed)
  Follow up Call-  Call back number 09/11/2020  Post procedure Call Back phone  # [phone]  Permission to leave phone message Yes  Some recent data might be hidden    LMOM to call back with any questions or concerns.  Also, call back if patient has developed fever, respiratory issues or been dx with COVID or had any family members or close contacts diagnosed since her procedure.

## 2020-09-13 NOTE — Telephone Encounter (Signed)
Left message on f/u call 

## 2020-10-10 ENCOUNTER — Encounter: Payer: Managed Care, Other (non HMO) | Admitting: Gastroenterology

## 2022-07-02 DIAGNOSIS — H43813 Vitreous degeneration, bilateral: Secondary | ICD-10-CM | POA: Diagnosis not present

## 2022-07-02 DIAGNOSIS — H5213 Myopia, bilateral: Secondary | ICD-10-CM | POA: Diagnosis not present

## 2022-07-02 DIAGNOSIS — H2513 Age-related nuclear cataract, bilateral: Secondary | ICD-10-CM | POA: Diagnosis not present

## 2022-07-02 DIAGNOSIS — H524 Presbyopia: Secondary | ICD-10-CM | POA: Diagnosis not present

## 2022-07-02 DIAGNOSIS — H35371 Puckering of macula, right eye: Secondary | ICD-10-CM | POA: Diagnosis not present

## 2022-08-26 DIAGNOSIS — Z0100 Encounter for examination of eyes and vision without abnormal findings: Secondary | ICD-10-CM | POA: Diagnosis not present

## 2022-11-04 DIAGNOSIS — D2272 Melanocytic nevi of left lower limb, including hip: Secondary | ICD-10-CM | POA: Diagnosis not present

## 2022-11-04 DIAGNOSIS — D2271 Melanocytic nevi of right lower limb, including hip: Secondary | ICD-10-CM | POA: Diagnosis not present

## 2022-11-04 DIAGNOSIS — L821 Other seborrheic keratosis: Secondary | ICD-10-CM | POA: Diagnosis not present

## 2022-11-04 DIAGNOSIS — L814 Other melanin hyperpigmentation: Secondary | ICD-10-CM | POA: Diagnosis not present

## 2022-11-04 DIAGNOSIS — D2261 Melanocytic nevi of right upper limb, including shoulder: Secondary | ICD-10-CM | POA: Diagnosis not present

## 2022-11-04 DIAGNOSIS — D1801 Hemangioma of skin and subcutaneous tissue: Secondary | ICD-10-CM | POA: Diagnosis not present

## 2022-11-04 DIAGNOSIS — D225 Melanocytic nevi of trunk: Secondary | ICD-10-CM | POA: Diagnosis not present

## 2022-12-03 DIAGNOSIS — Z1231 Encounter for screening mammogram for malignant neoplasm of breast: Secondary | ICD-10-CM | POA: Diagnosis not present

## 2022-12-31 DIAGNOSIS — E785 Hyperlipidemia, unspecified: Secondary | ICD-10-CM | POA: Diagnosis not present

## 2022-12-31 DIAGNOSIS — M8580 Other specified disorders of bone density and structure, unspecified site: Secondary | ICD-10-CM | POA: Diagnosis not present

## 2022-12-31 DIAGNOSIS — Z79899 Other long term (current) drug therapy: Secondary | ICD-10-CM | POA: Diagnosis not present

## 2023-01-08 DIAGNOSIS — M8580 Other specified disorders of bone density and structure, unspecified site: Secondary | ICD-10-CM | POA: Diagnosis not present

## 2023-01-08 DIAGNOSIS — N951 Menopausal and female climacteric states: Secondary | ICD-10-CM | POA: Diagnosis not present

## 2023-01-08 DIAGNOSIS — L989 Disorder of the skin and subcutaneous tissue, unspecified: Secondary | ICD-10-CM | POA: Diagnosis not present

## 2023-01-08 DIAGNOSIS — Z0000 Encounter for general adult medical examination without abnormal findings: Secondary | ICD-10-CM | POA: Diagnosis not present

## 2023-01-08 DIAGNOSIS — R82998 Other abnormal findings in urine: Secondary | ICD-10-CM | POA: Diagnosis not present

## 2023-01-08 DIAGNOSIS — M1990 Unspecified osteoarthritis, unspecified site: Secondary | ICD-10-CM | POA: Diagnosis not present

## 2023-01-08 DIAGNOSIS — F418 Other specified anxiety disorders: Secondary | ICD-10-CM | POA: Diagnosis not present

## 2023-01-08 DIAGNOSIS — Z1339 Encounter for screening examination for other mental health and behavioral disorders: Secondary | ICD-10-CM | POA: Diagnosis not present

## 2023-01-08 DIAGNOSIS — E785 Hyperlipidemia, unspecified: Secondary | ICD-10-CM | POA: Diagnosis not present

## 2023-01-08 DIAGNOSIS — G4700 Insomnia, unspecified: Secondary | ICD-10-CM | POA: Diagnosis not present

## 2023-01-08 DIAGNOSIS — Z1331 Encounter for screening for depression: Secondary | ICD-10-CM | POA: Diagnosis not present

## 2023-02-03 DIAGNOSIS — Z0100 Encounter for examination of eyes and vision without abnormal findings: Secondary | ICD-10-CM | POA: Diagnosis not present

## 2023-05-20 DIAGNOSIS — L2989 Other pruritus: Secondary | ICD-10-CM | POA: Diagnosis not present

## 2023-05-20 DIAGNOSIS — H90A21 Sensorineural hearing loss, unilateral, right ear, with restricted hearing on the contralateral side: Secondary | ICD-10-CM | POA: Diagnosis not present

## 2023-05-20 DIAGNOSIS — H9313 Tinnitus, bilateral: Secondary | ICD-10-CM | POA: Diagnosis not present

## 2023-05-20 DIAGNOSIS — H903 Sensorineural hearing loss, bilateral: Secondary | ICD-10-CM | POA: Diagnosis not present

## 2024-01-22 ENCOUNTER — Telehealth: Payer: Self-pay | Admitting: Pharmacy Technician

## 2024-01-22 NOTE — Telephone Encounter (Signed)
 Auth Submission: APPROVED Site of care: Site of care: [facility] Payer: [employer] Medication & CPT/J Code(s) submitted: Prolia (Denosumab) [id number] Diagnosis Code:  Route of submission (phone, fax, portal): PORTAL Phone # Fax # Auth type: Buy/Bill HB Units/visits requested: 60MG  [id number] X2 Reference number: [id number] Approval from: 01/21/25 to 01/21/25

## 2024-01-29 ENCOUNTER — Other Ambulatory Visit (HOSPITAL_COMMUNITY): Payer: Self-pay | Admitting: *Deleted

## 2024-02-01 ENCOUNTER — Encounter (HOSPITAL_COMMUNITY)
Admission: RE | Admit: 2024-02-01 | Discharge: 2024-02-01 | Disposition: A | Discharge status: Home / Self Care | Payer: Self-pay | Source: Ambulatory Visit | Attending: Internal Medicine | Admitting: Internal Medicine

## 2024-02-01 DIAGNOSIS — M810 Age-related osteoporosis without current pathological fracture: Secondary | ICD-10-CM | POA: Insufficient documentation

## 2024-02-01 MED ORDER — DENOSUMAB 60 MG/ML ~~LOC~~ SOSY
60.0000 mg | PREFILLED_SYRINGE | Freq: Once | SUBCUTANEOUS | Status: AC
  Administered 2024-02-01: 60 mg via SUBCUTANEOUS

## 2024-02-01 MED ORDER — DENOSUMAB 60 MG/ML ~~LOC~~ SOSY
PREFILLED_SYRINGE | SUBCUTANEOUS | Status: AC
  Filled 2024-02-01: qty 1

## 2024-07-26 ENCOUNTER — Other Ambulatory Visit (HOSPITAL_COMMUNITY): Payer: Self-pay | Admitting: Internal Medicine

## 2024-07-26 ENCOUNTER — Telehealth (HOSPITAL_COMMUNITY): Payer: Self-pay | Admitting: Pharmacy Technician

## 2024-07-26 DIAGNOSIS — M810 Age-related osteoporosis without current pathological fracture: Secondary | ICD-10-CM | POA: Insufficient documentation

## 2024-07-26 NOTE — Telephone Encounter (Signed)
 Auth Submission: APPROVED Site of care: [facility] Payer: [employer] MEDICARE Medication & CPT/J Code(s) submitted: Stoboclo (denosumab -bmwo) [id number] Diagnosis Code: M81.0 Route of submission (phone, fax, portal): PORTAL Phone # Fax # Auth type: Buy/Bill HB Units/visits requested: 60mg  x 2 doses, q 6 months Reference number: [id number] Approval from: 07/26/24 to 07/26/25        Dagoberto Armour, CPhT [facility] Phone: [phone] 07/26/2024

## 2024-08-05 ENCOUNTER — Inpatient Hospital Stay (HOSPITAL_COMMUNITY): Admission: RE | Admit: 2024-08-05 | Source: Ambulatory Visit

## 2024-08-05 VITALS — BP 123/74 | HR 69 | Temp 97.3°F | Resp 16

## 2024-08-05 DIAGNOSIS — M810 Age-related osteoporosis without current pathological fracture: Secondary | ICD-10-CM

## 2024-08-05 MED ORDER — DENOSUMAB-BMWO 60 MG/ML ~~LOC~~ SOSY
PREFILLED_SYRINGE | SUBCUTANEOUS | Status: AC
  Filled 2024-08-05: qty 1

## 2024-08-05 MED ORDER — DENOSUMAB-BMWO 60 MG/ML ~~LOC~~ SOSY
60.0000 mg | PREFILLED_SYRINGE | Freq: Once | SUBCUTANEOUS | Status: AC
  Administered 2024-08-05: 60 mg via SUBCUTANEOUS

## 2025-02-03 ENCOUNTER — Encounter (HOSPITAL_COMMUNITY)
